# Patient Record
Sex: Female | Born: 2000 | Hispanic: Yes | Marital: Single | Smoking: Never smoker
Health system: Southern US, Community
[De-identification: ages and names within clinical notes are randomized; demographics above are authoritative.]

## PROBLEM LIST (undated history)

## (undated) DIAGNOSIS — J45909 Unspecified asthma, uncomplicated: Secondary | ICD-10-CM

---
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
## 2023-11-17 ENCOUNTER — Other Ambulatory Visit: Payer: Self-pay

## 2023-11-17 ENCOUNTER — Ambulatory Visit (INDEPENDENT_AMBULATORY_CARE_PROVIDER_SITE_OTHER): Payer: Self-pay

## 2023-11-17 ENCOUNTER — Encounter (HOSPITAL_COMMUNITY): Payer: Self-pay

## 2023-11-17 ENCOUNTER — Ambulatory Visit (HOSPITAL_COMMUNITY)
Admission: EM | Admit: 2023-11-17 | Discharge: 2023-11-17 | Disposition: A | Discharge status: Home / Self Care | Payer: Self-pay

## 2023-11-17 DIAGNOSIS — R0789 Other chest pain: Secondary | ICD-10-CM

## 2023-11-17 DIAGNOSIS — K2900 Acute gastritis without bleeding: Secondary | ICD-10-CM

## 2023-11-17 DIAGNOSIS — R1012 Left upper quadrant pain: Secondary | ICD-10-CM

## 2023-11-17 HISTORY — DX: Unspecified asthma, uncomplicated: J45.909

## 2023-11-17 MED ORDER — OMEPRAZOLE 20 MG PO CPDR: 20.0000 mg | DELAYED_RELEASE_CAPSULE | Freq: Two times a day (BID) | ORAL | 0 refills | Status: AC

## 2023-11-17 MED ORDER — ALUM & MAG HYDROXIDE-SIMETH 200-200-20 MG/5ML PO SUSP
ORAL | Status: AC
  Filled 2023-11-17: qty 30

## 2023-11-17 MED ORDER — LIDOCAINE VISCOUS HCL 2 % MT SOLN
15.0000 mL | Freq: Once | OROMUCOSAL | Status: AC
  Administered 2023-11-17: 15 mL via OROMUCOSAL

## 2023-11-17 MED ORDER — DICLOFENAC SODIUM 1 % EX GEL: 2.0000 g | Freq: Four times a day (QID) | CUTANEOUS | 0 refills | Status: AC

## 2023-11-17 MED ORDER — ALUM & MAG HYDROXIDE-SIMETH 200-200-20 MG/5ML PO SUSP
30.0000 mL | Freq: Once | ORAL | Status: AC
  Administered 2023-11-17: 30 mL via ORAL

## 2023-11-17 MED ORDER — LIDOCAINE VISCOUS HCL 2 % MT SOLN
OROMUCOSAL | Status: AC
  Filled 2023-11-17: qty 15

## 2023-11-17 NOTE — ED Triage Notes (Signed)
 Pt c/o of chest discomfort, chest tightness, and chest pain since Friday at 1600 after eating.  Since then she had intermittent pain radiating to her LUQ/upper abdomen, back ,and left shoulder. Pt states for 1-2 years she had random short episodes of chest pain.

## 2023-11-17 NOTE — Discharge Instructions (Addendum)
 Your EKG does not show signs of heart attacks NO weight lifting until you are cleared by your primary care doctor Avoid Ibuprofen , Advil , Aleeve. But OK to take Tylenol  for pain up to 1000 mg every 8 hours

## 2023-11-17 NOTE — ED Provider Notes (Signed)
 [facility]    CSN: [id number] Arrival date & time: 11/17/23  1524      History   Chief Complaint Chief Complaint  Patient presents with   Chest Pain    HPI Shannon Horn is a 23 y.o. female who presents with L chest pain since after eating fish with lots of paprika. She did not try anything for the discomfort. She describes it was pressure and pain gets 8/10 at its worst and right now is 2/10. The pain is constant. Nothing makes it better or worse and has started at rest. Her last Covid shot was 3 years ago Denies N/V/C. The pain has been radiating across her chest and upper back. Right now she feels in on her L shoulder R mid back.  She admits she works out with lifting weight x 3 weeks. Denies injuring herself.  Denies SOB    Past Medical History:  Diagnosis Date   Asthma     There are no active problems to display for this patient.   History reviewed. No pertinent surgical history.  OB History   No obstetric history on file.      Home Medications    Prior to Admission medications   Medication Sig Start Date End Date Taking? Authorizing Provider  albuterol  (PROVENTIL  HFA;VENTOLIN  HFA) 108 (90 BASE) MCG/ACT inhaler Inhale 2 puffs into the lungs every 4 (four) hours as needed for wheezing or shortness of breath. 09/12/15  Yes Vedia Geralds, NP  diclofenac  Sodium (VOLTAREN ) 1 % GEL Apply 2 g topically 4 (four) times daily. 11/17/23  Yes Rodriguez-Southworth, Lamond Pilot, PA-C  Multiple Vitamin (MULTIVITAMIN) capsule Take 1 capsule by mouth daily.   Yes [provider]  omeprazole  (PRILOSEC) 20 MG capsule Take 1 capsule (20 mg total) by mouth 2 (two) times daily before a meal. 11/17/23  Yes Rodriguez-Southworth, Lamond Pilot, PA-C  acetaminophen  (TYLENOL ) 500 MG tablet Take 1 tablet (500 mg total) by mouth every 6 (six) hours as needed. 04/03/19   Fawze, Mina A, PA-C  ibuprofen  (ADVIL ) 400 MG tablet Take 1 tablet (400 mg total) by mouth every 6 (six)  hours as needed. 04/03/19   Fawze, Mina A, PA-C  loratadine (CLARITIN) 10 MG tablet Take by mouth.    [provider]    Family History History reviewed. No pertinent family history.  Social History Social History   Tobacco Use   Smoking status: Never    Passive exposure: Never   Smokeless tobacco: Never  Substance Use Topics   Alcohol use: Never   Drug use: Never     Allergies   Patient has no known allergies.   Review of Systems Review of Systems  As noted in HPI Physical Exam Triage Vital Signs ED Triage Vitals  Encounter Vitals Group     BP 11/17/23 1717 132/82     Systolic BP Percentile --      Diastolic BP Percentile --      Pulse Rate 11/17/23 1717 (!) 110     Resp 11/17/23 1717 18     Temp 11/17/23 1717 98.5 F (36.9 C)     Temp Source 11/17/23 1717 Oral     SpO2 11/17/23 1717 98 %     Weight --      Height --      Head Circumference --      Peak Flow --      Pain Score 11/17/23 1713 2     Pain Loc --  Pain Education --      Exclude from Growth Chart --    No data found.  Updated Vital Signs BP 118/76 (BP Location: Left Arm)   Pulse (!) 105   Temp 98.9 F (37.2 C) (Oral)   Resp 18   LMP 10/29/2023   SpO2 97%   Visual Acuity Right Eye Distance:   Left Eye Distance:   Bilateral Distance:    Right Eye Near:   Left Eye Near:    Bilateral Near:     Physical Exam Physical Exam Vitals signs and nursing note reviewed.  Constitutional:      General: She is not in acute distress.    Appearance: Normal appearance. She is not ill-appearing, toxic-appearing or diaphoretic.  HENT:     Head: Normocephalic.     Right Ear:   external ear normal.     Left Ear: external ear normal.     Nose: Nose normal.     Eyes:     General: No scleral icterus.       Right eye: No discharge.        Left eye: No discharge.     Conjunctiva/sclera: Conjunctivae normal.  Neck:     Musculoskeletal: Neck supple. No neck rigidity. L top trapezius and  bicep tendon are tender.  Cardiovascular:     Rate and Rhythm: Slight tachy rate and regular rhythm.     Heart sounds: No murmur.  Pulmonary:     Effort: Pulmonary effort is normal.     Breath sounds: Normal breath sounds. No chest wall pain Abdominal:     General: Bowel sounds are normal. There is no distension.     Palpations: Abdomen is soft. There is no mass.     Tenderness: There is no abdominal tenderness. There is no guarding or rebound.     Hernia: No hernia is present.  Musculoskeletal: Normal range of motion. No back tenderness where she feels the pain radiate from her chest.  Lymphadenopathy:     Cervical: No cervical adenopathy.  Skin:    General: Skin is warm and dry.     Coloration: Skin is not jaundiced.     Findings: No rash.  Neurological:     Mental Status: She is alert and oriented to person, place, and time.     Gait: Gait normal.  Psychiatric:        Mood and Affect: Mood normal.        Behavior: Behavior normal.        Thought Content: Thought content normal.        Judgment: Judgment normal.    UC Treatments / Results  Labs (all labs ordered are listed, but only abnormal results are displayed) Labs Reviewed - No data to display  EKG  NSR with arrhythmia Has non specifif  T wave changes. Inversion noted on V1  Radiology DG Chest 2 View Result Date: 11/17/2023 CLINICAL DATA:  Left-sided chest pain for 3 days. EXAM: CHEST - 2 VIEW COMPARISON:  X-ray 09/12/2015 FINDINGS: No consolidation, pneumothorax or effusion. No edema. Normal cardiopericardial silhouette. Minimal curvature of the midthoracic spine could be positional. IMPRESSION: No acute cardiopulmonary disease. Electronically Signed   By: Adrianna Horde M.D.   On: 11/17/2023 18:57    Procedures Procedures (including critical care time)  Medications Ordered in UC Medications  alum & mag hydroxide-simeth (MAALOX/MYLANTA) 200-200-20 MG/5ML suspension 30 mL (30 mLs Oral Given 11/17/23 1806)   lidocaine  (XYLOCAINE ) 2 % viscous mouth solution 15  mL (15 mLs Mouth/Throat Given 11/17/23 1806)    Initial Impression / Assessment and Plan / UC Course  I have reviewed the triage vital signs and the nursing notes. She was given GI cocktail and feels this helped the LUQ pain, but still feels slight discomfort. Her L shoulder/trap area feels more tender.  Pertinent  imaging results that were available during my care of the patient were reviewed by me and considered in my medical decision making (see chart for details).  Gastritis with possible esophageal spasm L shoulder strain  I placed her on Prilosec as noted and Voltaren  gel for the L trap and shoulder tenderness Needs to FU with PCP next week.    Final Clinical Impressions(s) / UC Diagnoses   Final diagnoses:  Other chest pain  Abdominal pain, left upper quadrant  Acute superficial gastritis without hemorrhage     Discharge Instructions      Your EKG does not show signs of heart attacks NO weight lifting until you are cleared by your primary care doctor Avoid Ibuprofen , Advil , Aleeve. But OK to take Tylenol  for pain up to 1000 mg every 8 hours     ED Prescriptions     Medication Sig Dispense Auth. Provider   omeprazole  (PRILOSEC) 20 MG capsule Take 1 capsule (20 mg total) by mouth 2 (two) times daily before a meal. 28 capsule Rodriguez-Southworth, Pattie Flaharty, PA-C   diclofenac  Sodium (VOLTAREN ) 1 % GEL Apply 2 g topically 4 (four) times daily. 150 g Rodriguez-Southworth, Lamond Pilot, PA-C      PDMP not reviewed this encounter.   Vonda Guadeloupe, PA-C 11/17/23 1610
